# Patient Record
Sex: Female | Born: 2000 | Race: Black or African American | Hispanic: No | Marital: Single | Smoking: Never smoker
Health system: Southern US, Community
[De-identification: ages and names within clinical notes are randomized; demographics above are authoritative.]

## PROBLEM LIST (undated history)

## (undated) DIAGNOSIS — F79 Unspecified intellectual disabilities: Secondary | ICD-10-CM

---
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
## 2020-02-18 IMAGING — CT CT PELVIS W/ CM
2 of 3 series · 16 of 46 positions shown, 18 images · IV contrast (omnipaque)
Comparison: None.

CLINICAL DATA: Abscess involving left labia and surrounding
tissues.

EXAM:
CT PELVIS WITH CONTRAST
TECHNIQUE: Multidetector CT imaging of the pelvis was performed using the
standard protocol following the bolus administration of intravenous
contrast.
CONTRAST:  100mL OMNIPAQUE IOHEXOL 300 MG/ML  SOLN

[Series 3: pelvis with 5.0 · axial · 0.94mm/px · z∈[+629,+909]mm · 13 of 66 slices shown, 15 images]
[im 5/66  soft-tissue]
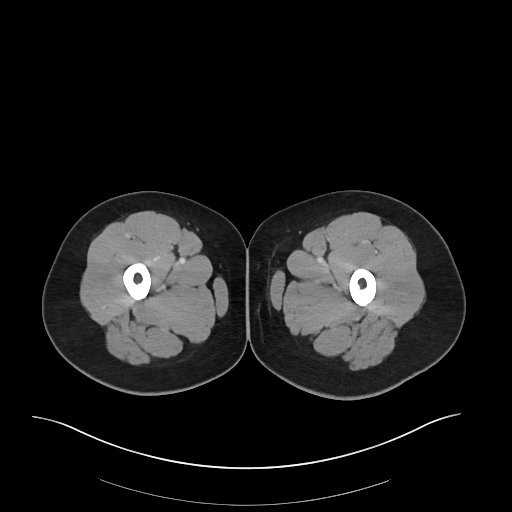
[im 5/66  bone]
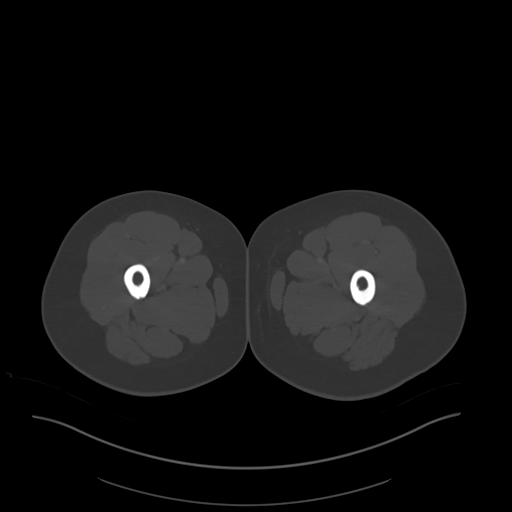
[im 9/66  soft-tissue]
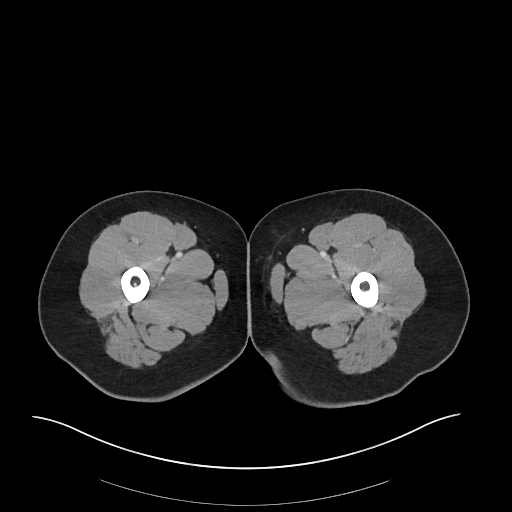
[im 13/66  soft-tissue]
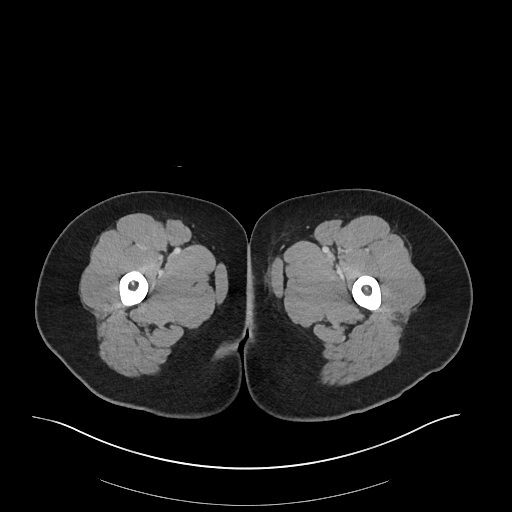
[im 19/66  soft-tissue]
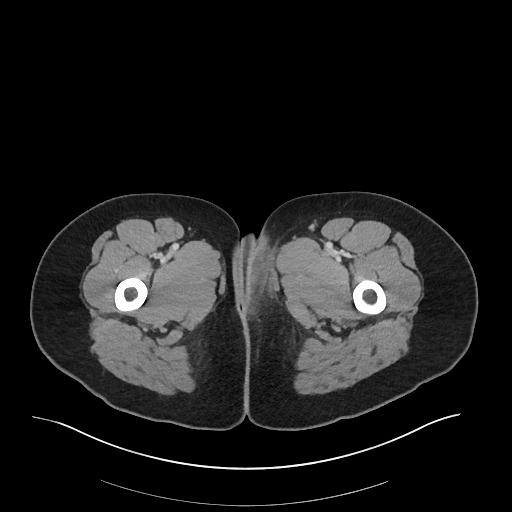
[im 24/66  soft-tissue]
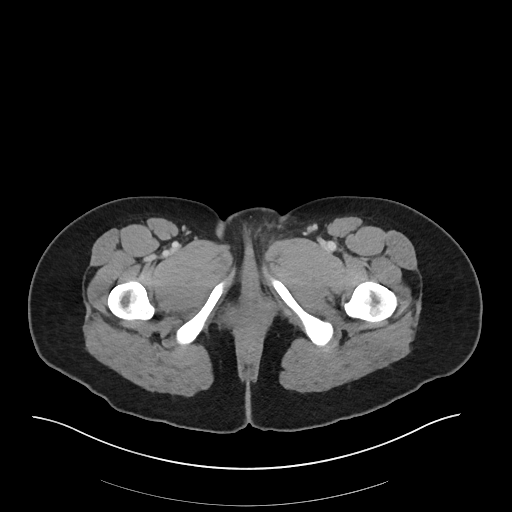
[im 28/66  soft-tissue]
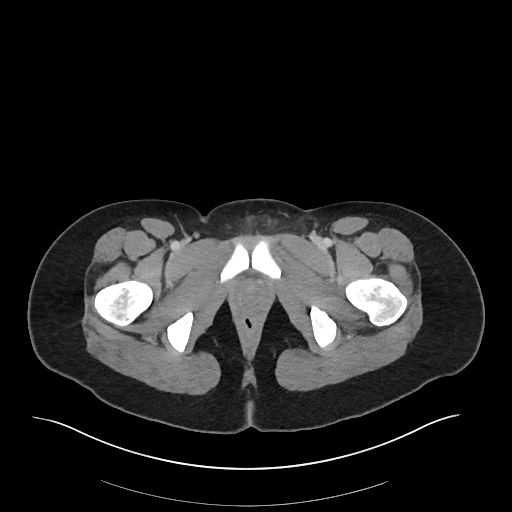
[im 34/66  soft-tissue]
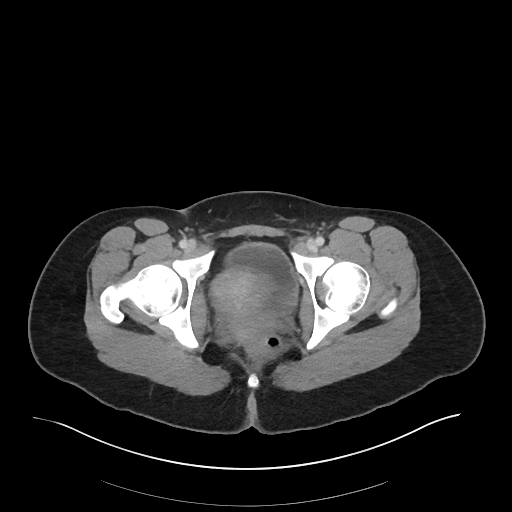
[im 38/66  soft-tissue]
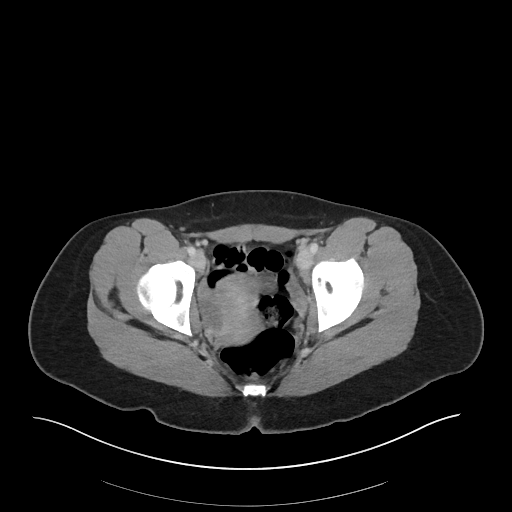
[im 42/66  soft-tissue]
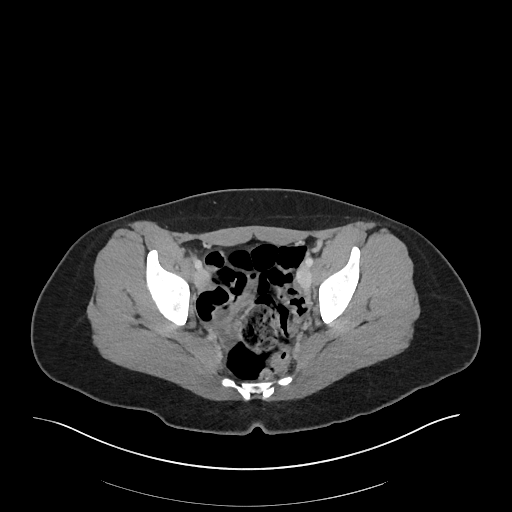
[im 42/66  bone]
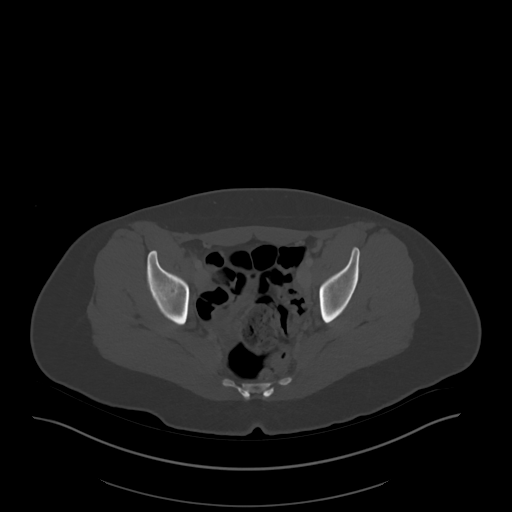
[im 47/66  soft-tissue]
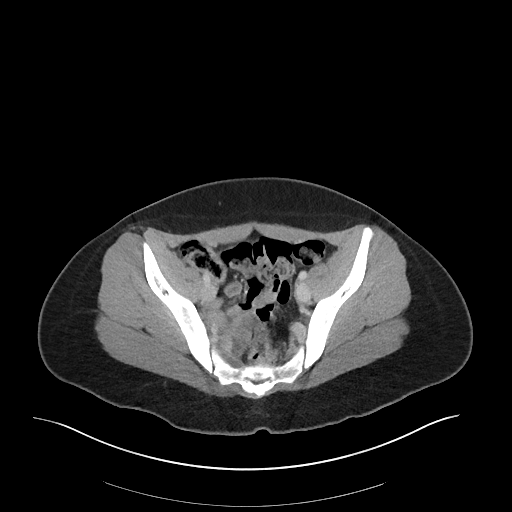
[im 53/66  soft-tissue]
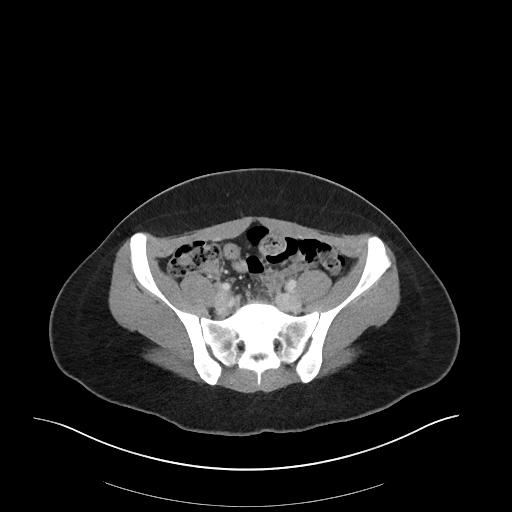
[im 57/66  soft-tissue]
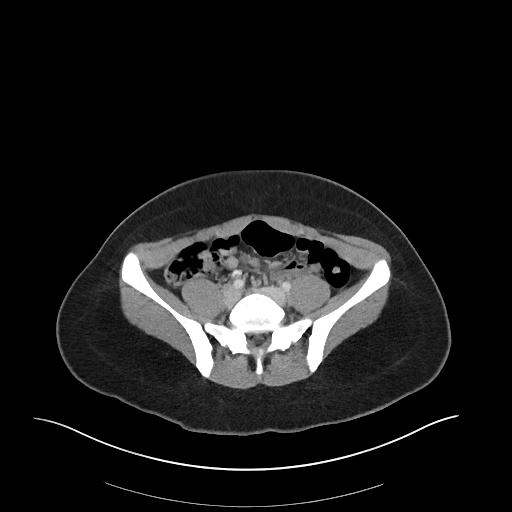
[im 61/66  soft-tissue]
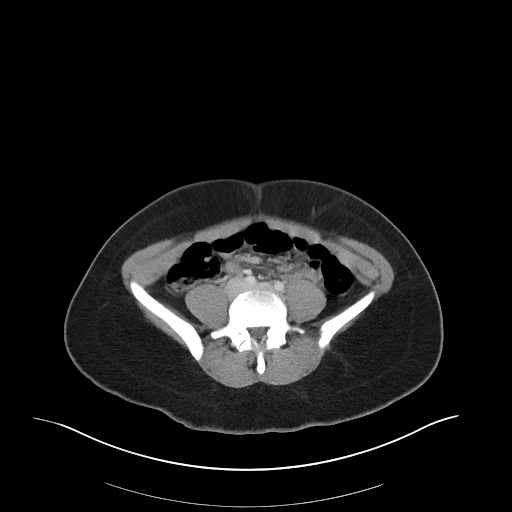

[Series 5: pelvis with 2.0 cor · coronal · 0.69mm/px · 3 of 179 slices shown]
[im 60/179  soft-tissue]
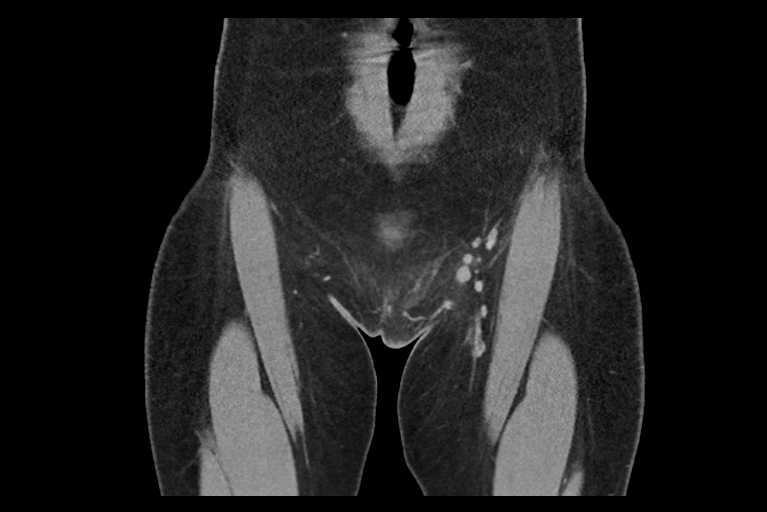
[im 80/179  soft-tissue]
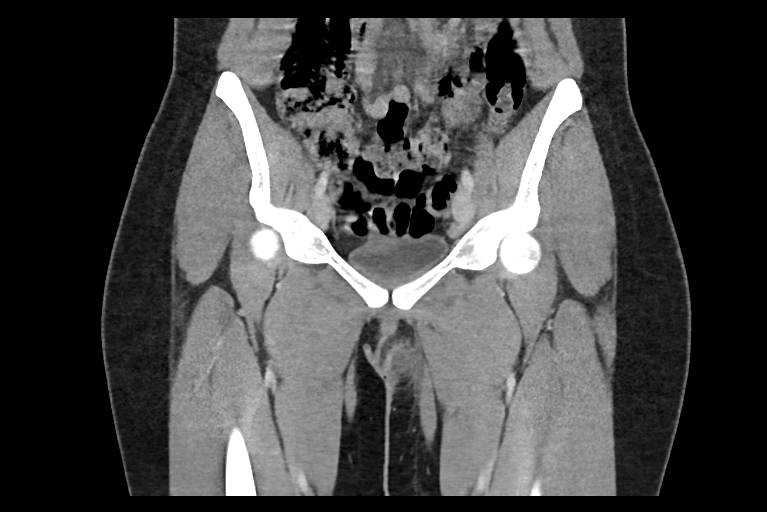
[im 99/179  soft-tissue]
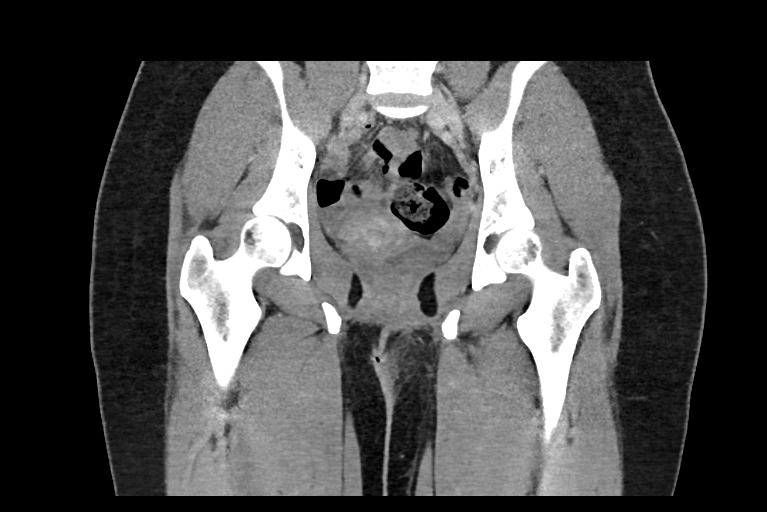

[16 of 46 positions shown; findings below may reference images not displayed]

FINDINGS: Urinary Tract: Distal ureters are decompressed. Urinary bladder only
minimally distended.

Bowel: No perirectal fluid collection. No rectal thickening. Pelvic
large and small bowel loops are unremarkable. Normal appendix
tentatively identified

Vascular/Lymphatic: Unremarkable vascular structures. Prominent 9 mm
left inguinal node is likely reactive. Additional small left
external iliac nodes.

Reproductive:  Uterus and adnexa are unremarkable.  No adnexal mass.

Other: Skin thickening and subcutaneous edema involving of the left
labia and perineum. Possible ill-defined subcutaneous fluid but no
well-defined or peripherally enhancing fluid collection.
Inflammatory changes extend to the gracilis muscle but no
intramuscular involvement. No soft tissue air.

Musculoskeletal: There are no acute or suspicious osseous
abnormalities. No intramuscular fluid collection.
IMPRESSION: Skin thickening and inflammatory stranding involving the left labia
and perineum with ill-defined subcutaneous edema/fluid but no
peripherally enhancing or organized fluid collection.
